# Patient Record
Sex: Male | Born: 1980 | Race: Black or African American | Hispanic: No | Marital: Married | Smoking: Never smoker
Health system: Southern US, Community
[De-identification: ages and names within clinical notes are randomized; demographics above are authoritative.]

---

## 2003-08-15 ENCOUNTER — Emergency Department (HOSPITAL_COMMUNITY): Admission: EM | Admit: 2003-08-15 | Discharge: 2003-08-16 | Payer: Self-pay | Admitting: Emergency Medicine

## 2003-08-18 ENCOUNTER — Emergency Department (HOSPITAL_COMMUNITY): Admission: EM | Admit: 2003-08-18 | Discharge: 2003-08-19 | Payer: Self-pay | Admitting: Podiatry
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
## 2019-09-19 ENCOUNTER — Other Ambulatory Visit: Payer: Self-pay

## 2019-09-19 ENCOUNTER — Emergency Department (HOSPITAL_COMMUNITY): Payer: No Typology Code available for payment source

## 2019-09-19 ENCOUNTER — Encounter (HOSPITAL_COMMUNITY): Payer: Self-pay | Admitting: Emergency Medicine

## 2019-09-19 ENCOUNTER — Emergency Department (HOSPITAL_COMMUNITY)
Admission: EM | Admit: 2019-09-19 | Discharge: 2019-09-20 | Disposition: A | Discharge status: Other Acute Inpatient Hospital | Payer: No Typology Code available for payment source | Attending: Emergency Medicine | Admitting: Emergency Medicine

## 2019-09-19 DIAGNOSIS — F1020 Alcohol dependence, uncomplicated: Secondary | ICD-10-CM | POA: Diagnosis not present

## 2019-09-19 DIAGNOSIS — F332 Major depressive disorder, recurrent severe without psychotic features: Secondary | ICD-10-CM | POA: Diagnosis present

## 2019-09-19 DIAGNOSIS — F1010 Alcohol abuse, uncomplicated: Secondary | ICD-10-CM

## 2019-09-19 DIAGNOSIS — R45851 Suicidal ideations: Secondary | ICD-10-CM | POA: Diagnosis not present

## 2019-09-19 DIAGNOSIS — Z20822 Contact with and (suspected) exposure to covid-19: Secondary | ICD-10-CM | POA: Diagnosis not present

## 2019-09-19 DIAGNOSIS — Y900 Blood alcohol level of less than 20 mg/100 ml: Secondary | ICD-10-CM | POA: Diagnosis not present

## 2019-09-19 MED ORDER — SODIUM CHLORIDE 0.9% FLUSH
3.0000 mL | Freq: Once | INTRAVENOUS | Status: AC
  Administered 2019-09-20: 3 mL via INTRAVENOUS

## 2019-09-19 NOTE — ED Triage Notes (Signed)
Patient states he has been drinking a 5th a day for "a long time." Patient states that drink was this morning. Patient noted to be tremulous and diaphoretic. Patient complaining of chest pain.

## 2019-09-20 ENCOUNTER — Ambulatory Visit (HOSPITAL_COMMUNITY)
Admission: EM | Admit: 2019-09-20 | Discharge: 2019-09-21 | Disposition: A | Discharge status: Home / Self Care | Payer: No Typology Code available for payment source | Attending: Nurse Practitioner | Admitting: Nurse Practitioner

## 2019-09-20 ENCOUNTER — Encounter (HOSPITAL_COMMUNITY): Payer: Self-pay | Admitting: Emergency Medicine

## 2019-09-20 DIAGNOSIS — F10139 Alcohol abuse with withdrawal, unspecified: Secondary | ICD-10-CM | POA: Diagnosis present

## 2019-09-20 DIAGNOSIS — F1014 Alcohol abuse with alcohol-induced mood disorder: Secondary | ICD-10-CM | POA: Diagnosis not present

## 2019-09-20 LAB — SARS CORONAVIRUS 2 BY RT PCR (HOSPITAL ORDER, PERFORMED IN ~~LOC~~ HOSPITAL LAB): SARS Coronavirus 2: NEGATIVE

## 2019-09-20 LAB — BASIC METABOLIC PANEL
Anion gap: 18 — ABNORMAL HIGH (ref 5–15)
BUN: 15 mg/dL (ref 6–20)
CO2: 24 mmol/L (ref 22–32)
Calcium: 10 mg/dL (ref 8.9–10.3)
Chloride: 93 mmol/L — ABNORMAL LOW (ref 98–111)
Creatinine, Ser: 1.1 mg/dL (ref 0.61–1.24)
GFR calc Af Amer: >60 mL/min (ref >60)
GFR calc non Af Amer: >60 mL/min (ref >60)
Glucose, Bld: 112 mg/dL — ABNORMAL HIGH (ref 70–99)
Potassium: 3.3 mmol/L — ABNORMAL LOW (ref 3.5–5.1)
Sodium: 135 mmol/L (ref 135–145)

## 2019-09-20 LAB — HEPATIC FUNCTION PANEL
ALT: 43 U/L (ref 0–44)
AST: 45 U/L — ABNORMAL HIGH (ref 15–41)
Albumin: 4.5 g/dL (ref 3.5–5.0)
Alkaline Phosphatase: 39 U/L (ref 38–126)
Bilirubin, Direct: <0.1 mg/dL (ref 0.0–0.2)
Total Bilirubin: 1.4 mg/dL — ABNORMAL HIGH (ref 0.3–1.2)
Total Protein: 7.1 g/dL (ref 6.5–8.1)

## 2019-09-20 LAB — CBC
HCT: 40.9 % (ref 39.0–52.0)
Hemoglobin: 13.8 g/dL (ref 13.0–17.0)
MCH: 30.3 pg (ref 26.0–34.0)
MCHC: 33.7 g/dL (ref 30.0–36.0)
MCV: 89.9 fL (ref 80.0–100.0)
Platelets: 182 10*3/uL (ref 150–400)
RBC: 4.55 MIL/uL (ref 4.22–5.81)
RDW: 13.1 % (ref 11.5–15.5)
WBC: 3.8 10*3/uL — ABNORMAL LOW (ref 4.0–10.5)
nRBC: 0 % (ref 0.0–0.2)

## 2019-09-20 LAB — ETHANOL: Alcohol, Ethyl (B): 16 mg/dL — ABNORMAL HIGH (ref <10)

## 2019-09-20 LAB — TROPONIN I (HIGH SENSITIVITY)
Troponin I (High Sensitivity): 4 ng/L (ref <18)
Troponin I (High Sensitivity): 4 ng/L (ref <18)

## 2019-09-20 LAB — LIPASE, BLOOD: Lipase: 20 U/L (ref 11–51)

## 2019-09-20 LAB — SALICYLATE LEVEL: Salicylate Lvl: <7 mg/dL — ABNORMAL LOW (ref 7.0–30.0)

## 2019-09-20 LAB — ACETAMINOPHEN LEVEL: Acetaminophen (Tylenol), Serum: <10 ug/mL — ABNORMAL LOW (ref 10–30)

## 2019-09-20 MED ORDER — ALUM & MAG HYDROXIDE-SIMETH 200-200-20 MG/5ML PO SUSP: 30.0000 mL | ORAL | Status: DC | PRN

## 2019-09-20 MED ORDER — LORAZEPAM 2 MG/ML IJ SOLN
0.5000 mg | Freq: Once | INTRAMUSCULAR | Status: AC
  Administered 2019-09-20: 0.5 mg via INTRAVENOUS
  Filled 2019-09-20: qty 1

## 2019-09-20 MED ORDER — SODIUM CHLORIDE 0.9 % IV BOLUS
1000.0000 mL | Freq: Once | INTRAVENOUS | Status: AC
  Administered 2019-09-20: 1000 mL via INTRAVENOUS

## 2019-09-20 MED ORDER — THIAMINE HCL 100 MG/ML IJ SOLN
100.0000 mg | Freq: Once | INTRAMUSCULAR | Status: AC
  Administered 2019-09-20: 100 mg via INTRAMUSCULAR
  Filled 2019-09-20: qty 2

## 2019-09-20 MED ORDER — ACETAMINOPHEN 325 MG PO TABS
650.0000 mg | ORAL_TABLET | Freq: Once | ORAL | Status: AC
  Administered 2019-09-20: 650 mg via ORAL
  Filled 2019-09-20: qty 2

## 2019-09-20 MED ORDER — ADULT MULTIVITAMIN W/MINERALS CH
1.0000 | ORAL_TABLET | Freq: Every day | ORAL | Status: DC
  Administered 2019-09-20 – 2019-09-21 (×2): 1 via ORAL
  Filled 2019-09-20 (×2): qty 1

## 2019-09-20 MED ORDER — THIAMINE HCL 100 MG PO TABS
100.0000 mg | ORAL_TABLET | Freq: Every day | ORAL | Status: DC
  Administered 2019-09-21: 100 mg via ORAL
  Filled 2019-09-20 (×2): qty 1

## 2019-09-20 MED ORDER — MAGNESIUM HYDROXIDE 400 MG/5ML PO SUSP: 30.0000 mL | Freq: Every day | ORAL | Status: DC | PRN

## 2019-09-20 MED ORDER — CHLORDIAZEPOXIDE HCL 25 MG PO CAPS: 25.0000 mg | ORAL_CAPSULE | Freq: Four times a day (QID) | ORAL | Status: DC | PRN

## 2019-09-20 MED ORDER — CHLORDIAZEPOXIDE HCL 25 MG PO CAPS: 25.0000 mg | ORAL_CAPSULE | Freq: Every day | ORAL | Status: DC

## 2019-09-20 MED ORDER — CHLORDIAZEPOXIDE HCL 25 MG PO CAPS
25.0000 mg | ORAL_CAPSULE | Freq: Four times a day (QID) | ORAL | Status: AC
  Administered 2019-09-20 (×4): 25 mg via ORAL
  Filled 2019-09-20 (×4): qty 1

## 2019-09-20 MED ORDER — TRAZODONE HCL 50 MG PO TABS
50.0000 mg | ORAL_TABLET | Freq: Every evening | ORAL | Status: DC | PRN
  Administered 2019-09-21: 50 mg via ORAL
  Filled 2019-09-20: qty 1

## 2019-09-20 MED ORDER — CHLORDIAZEPOXIDE HCL 25 MG PO CAPS
25.0000 mg | ORAL_CAPSULE | Freq: Three times a day (TID) | ORAL | Status: DC
  Administered 2019-09-21: 25 mg via ORAL
  Filled 2019-09-20: qty 1

## 2019-09-20 MED ORDER — LORAZEPAM 0.5 MG PO TABS
0.5000 mg | ORAL_TABLET | Freq: Once | ORAL | Status: AC
  Administered 2019-09-20: 0.5 mg via ORAL
  Filled 2019-09-20: qty 1

## 2019-09-20 MED ORDER — LOPERAMIDE HCL 2 MG PO CAPS: 2.0000 mg | ORAL_CAPSULE | ORAL | Status: DC | PRN

## 2019-09-20 MED ORDER — CHLORDIAZEPOXIDE HCL 25 MG PO CAPS
25.0000 mg | ORAL_CAPSULE | Freq: Once | ORAL | Status: AC
  Administered 2019-09-20: 25 mg via ORAL
  Filled 2019-09-20: qty 1

## 2019-09-20 MED ORDER — HYDROXYZINE HCL 25 MG PO TABS
25.0000 mg | ORAL_TABLET | Freq: Four times a day (QID) | ORAL | Status: DC | PRN
  Administered 2019-09-21: 25 mg via ORAL
  Filled 2019-09-20: qty 1

## 2019-09-20 MED ORDER — ONDANSETRON 4 MG PO TBDP: 4.0000 mg | ORAL_TABLET | Freq: Four times a day (QID) | ORAL | Status: DC | PRN

## 2019-09-20 MED ORDER — CHLORDIAZEPOXIDE HCL 25 MG PO CAPS: 25.0000 mg | ORAL_CAPSULE | ORAL | Status: DC

## 2019-09-20 MED ORDER — ACETAMINOPHEN 325 MG PO TABS
650.0000 mg | ORAL_TABLET | Freq: Four times a day (QID) | ORAL | Status: DC | PRN
  Administered 2019-09-20 – 2019-09-21 (×2): 650 mg via ORAL
  Filled 2019-09-20 (×2): qty 2

## 2019-09-20 NOTE — ED Notes (Signed)
D: Patient denies SI/HI A/V/H. Patient did ask to speak with a therapist. Patient is currently sitting on bed talking with therapist.  A: Patient spoke therapist. R: Patient remains safe on unit. Monitoring continues.

## 2019-09-20 NOTE — ED Notes (Signed)
Patient continues to rest in bed with eyes closed. Respirations even and non labored no distress noted. Monitoring continues.

## 2019-09-20 NOTE — ED Notes (Signed)
D: Pt denies SI/HI/AV hallucinations. Pt is pleasant and cooperative. Pt states he is here for detox from alcohol and depression.  A: Pt was offered support and encouragement. Patient skin assessed and belongings and patient searched. Patient skin is intact. No contraband found. Patient is COVID negative. Patient oriented to unit and staff. R: Pt has no complaints.Pt receptive to treatment and safety maintained on unit.

## 2019-09-20 NOTE — ED Provider Notes (Signed)
Behavioral Health Admission [facility]&P [facility])  Date: 09/20/19 Patient Name: Donald Woods MRN: [id number] Chief Complaint: No chief complaint on file.     Diagnoses:  Final diagnoses:  None    HPI: 39 year old separated male who presents unaccompanied to [facility] reporting symptoms of alcohol withdrawal and depression. Pt says he has been drinking approximately one fifth of liquor daily for several weeks. He says he is seeking treatment at this time because he realized he needed help to stop. Pt reports he experiences alcohol withdrawal symptoms including tremors, sweats and nausea. He suspects he experiences blackouts. He denies history of seizures. He denies other substance use. Pt describes his mood as depressed and states he has a history of depression. He acknowledges symptoms including social withdrawal, loss of interest in usual pleasures, fatigue, irritability, decreased sleep, decreased appetite and feelings of guilt, worthlessness and hopelessness. He reports recurring suicidal ideation with no current intent. He says approximately two months ago he attempted suicide by putting a machete to his throat. He says a cousin attempted suicide. Pt denies any history of intentional self-injurious behaviors. Pt denies current homicidal ideation or history of violence. Pt denies any history of auditory or visual hallucinations.  Pt says he recently relocated from [place] to [place] to be closer to his family. He says for a time he was essentially homeless. He says he now has a permanent residence and lives alone. He says he is unemployed. He has two children who are being care for by their mother. He denies history of abuse or trauma. He denies current legal problems. He denies access to firearms.  He reports he is currently seeing a therapist, Darliss Cheney. He says he as had two visits with a psychiatrist in the past but is not currently prescribed mental health medication. He says he was  admitted to a alcohol detox facility in [place] four years ago.  PHQ 2-9:    ED from 09/19/2019 in [facility]  Thoughts that you would be better off dead, or of hurting yourself in some way Several days  PHQ-9 Total Score 13        Total Time spent with patient: 30 minutes  Musculoskeletal  Strength & Muscle Tone: within normal limits Gait & Station: normal Patient leans: N/A  Psychiatric Specialty Exam  Presentation General Appearance: Casual  Eye Contact:Good  Speech:Clear and Coherent;Normal Rate  Speech Volume:Normal  Handedness:Right   Mood and Affect  Mood:Depressed  Affect:Congruent   Thought Process  Thought Processes:Coherent  Descriptions of Associations:Intact  Orientation:Full (Time, Place and Person)  Thought Content:WDL  Hallucinations:Hallucinations: None  Ideas of Reference:None  Suicidal Thoughts:Suicidal Thoughts: Yes, Passive  Homicidal Thoughts:Homicidal Thoughts: No   Sensorium  Memory:Immediate Good;Recent Good;Remote Good  Judgment:Fair  Insight:Fair   Executive Functions  Concentration:Fair  Attention Span:Fair  Recall:Good  Fund of Knowledge:Fair  Language:Fair   Psychomotor Activity  Psychomotor Activity:Psychomotor Activity: Normal   Assets  Assets:Communication Skills;Desire for Improvement   Sleep  Sleep:Sleep: Fair   Physical Exam Vitals and nursing note reviewed.  Constitutional:      Appearance: He is well-developed.  Cardiovascular:     Rate and Rhythm: Normal rate.  Pulmonary:     Effort: Pulmonary effort is normal.  Musculoskeletal:        General: Normal range of motion.  Skin:    General: Skin is warm.  Neurological:     Mental Status: He is alert and oriented to person, place, and  time.    Review of Systems  Constitutional: Negative.   HENT: Negative.   Eyes: Negative.   Respiratory: Negative.   Cardiovascular: Negative.   Gastrointestinal:  Negative.   Genitourinary: Negative.   Musculoskeletal: Negative.   Skin: Negative.   Neurological: Negative.   Endo/Heme/Allergies: Negative.   Psychiatric/Behavioral: Positive for substance abuse and suicidal ideas.    Blood pressure (!) 139/101, pulse 78, temperature (!) 97.5 F (36.4 C), temperature source Temporal, resp. rate 18, SpO2 100 %. There is no height or weight on file to calculate BMI.  Past Psychiatric History: Substance abuse, MDD   Is the patient at risk to self? Yes  Has the patient been a risk to self in the past 6 months? No .    Has the patient been a risk to self within the distant past? No   Is the patient a risk to others? No   Has the patient been a risk to others in the past 6 months? No   Has the patient been a risk to others within the distant past? No   Past Medical History: History reviewed. No pertinent past medical history. History reviewed. No pertinent surgical history.  Family History: History reviewed. No pertinent family history.  Social History:  Social History   Socioeconomic History  . Marital status: Married    Spouse name: Not on file  . Number of children: Not on file  . Years of education: Not on file  . Highest education level: Not on file  Occupational History  . Not on file  Tobacco Use  . Smoking status: Never Smoker  . Smokeless tobacco: Never Used  Substance and Sexual Activity  . Alcohol use: Yes    Comment: 1/5 of liquor a day  . Drug use: Never  . Sexual activity: Not on file  Other Topics Concern  . Not on file  Social History Narrative  . Not on file   Social Determinants of Health   Financial Resource Strain:   . Difficulty of Paying Living Expenses:   Food Insecurity:   . Worried About Programme researcher, broadcasting/film/video in the Last Year:   . Barista in the Last Year:   Transportation Needs:   . Freight forwarder (Medical):   Marland Kitchen Lack of Transportation (Non-Medical):   Physical Activity:   . Days of  Exercise per Week:   . Minutes of Exercise per Session:   Stress:   . Feeling of Stress :   Social Connections:   . Frequency of Communication with Friends and Family:   . Frequency of Social Gatherings with Friends and Family:   . Attends Religious Services:   . Active Member of Clubs or Organizations:   . Attends Banker Meetings:   Marland Kitchen Marital Status:   Intimate Partner Violence:   . Fear of Current or Ex-Partner:   . Emotionally Abused:   Marland Kitchen Physically Abused:   . Sexually Abused:     SDOH:  SDOH Screenings   Alcohol Screen:   . Last Alcohol Screening Score (AUDIT):   Depression (PHQ2-9): Medium Risk  . PHQ-2 Score: 13  Financial Resource Strain:   . Difficulty of Paying Living Expenses:   Food Insecurity:   . Worried About Programme researcher, broadcasting/film/video in the Last Year:   . [employer] of Food in the Last Year:   Housing:   . Last Housing Risk Score:   Physical Activity:   . Days of Exercise  per Week:   . Minutes of Exercise per Session:   Social Connections:   . Frequency of Communication with Friends and Family:   . Frequency of Social Gatherings with Friends and Family:   . Attends Religious Services:   . Active Member of Clubs or Organizations:   . Attends Banker Meetings:   Marland Kitchen Marital Status:   Stress:   . Feeling of Stress :   Tobacco Use: Low Risk   . Smoking Tobacco Use: Never Smoker  . Smokeless Tobacco Use: Never Used  Transportation Needs:   . Freight forwarder (Medical):   Marland Kitchen Lack of Transportation (Non-Medical):     Last Labs:  Admission on 09/19/2019, Discharged on 09/20/2019  Component Date Value Ref Range Status  . Sodium 09/19/2019 135  135 - 145 mmol/L Final  . Potassium 09/19/2019 3.3* 3.5 - 5.1 mmol/L Final  . Chloride 09/19/2019 93* 98 - 111 mmol/L Final  . CO2 09/19/2019 24  22 - 32 mmol/L Final  . Glucose, Bld 09/19/2019 112* 70 - 99 mg/dL Final   Glucose reference range applies only to samples taken after fasting  for at least 8 hours.  . BUN 09/19/2019 15  6 - 20 mg/dL Final  . Creatinine, Ser 09/19/2019 1.10  0.61 - 1.24 mg/dL Final  . Calcium 29/93/7169 10.0  8.9 - 10.3 mg/dL Final  . GFR calc non Af Amer 09/19/2019 >60  >60 mL/min Final  . GFR calc Af Amer 09/19/2019 >60  >60 mL/min Final  . Anion gap 09/19/2019 18* 5 - 15 Final   Performed at [facility], [address], [place], [place] [address]  . WBC 09/19/2019 3.8* 4.0 - 10.5 K/uL Final  . RBC 09/19/2019 4.55  4.22 - 5.81 MIL/uL Final  . Hemoglobin 09/19/2019 13.8  13.0 - 17.0 g/dL Final  . HCT 81/03/7508 40.9  39 - 52 % Final  . MCV 09/19/2019 89.9  80.0 - 100.0 fL Final  . MCH 09/19/2019 30.3  26.0 - 34.0 pg Final  . MCHC 09/19/2019 33.7  30.0 - 36.0 g/dL Final  . RDW 25/85/2778 13.1  11.5 - 15.5 % Final  . Platelets 09/19/2019 182  150 - 400 K/uL Final  . nRBC 09/19/2019 0.0  0.0 - 0.2 % Final   Performed at [facility], [address], [place], [place] [address]  . Troponin I (High Sensitivity) 09/19/2019 4  <18 ng/L Final   Comment: (NOTE) Elevated high sensitivity troponin I (hsTnI) values and significant  changes across serial measurements may suggest ACS but many other  chronic and acute conditions are known to elevate hsTnI results.  Refer to the "Links" section for chest pain algorithms and additional  guidance. Performed at [facility], [address] [place], [place] [address]   . Total Protein 09/20/2019 7.1  6.5 - 8.1 g/dL Final  . Albumin 31/54/0086 4.5  3.5 - 5.0 g/dL Final  . AST 76/19/5093 45* 15 - 41 U/L Final  . ALT 09/20/2019 43  0 - 44 U/L Final  . Alkaline Phosphatase 09/20/2019 39  38 - 126 U/L Final  . Total Bilirubin 09/20/2019 1.4* 0.3 - 1.2 mg/dL Final  . Bilirubin, Direct 09/20/2019 <0.1  0.0 - 0.2 mg/dL Final  . Indirect Bilirubin 09/20/2019 NOT CALCULATED  0.3 - 0.9 mg/dL Final   Performed at [facility], [address] [place], [place] [address]  . Lipase 09/20/2019 20  11 -  51 U/L Final   Performed at [facility], [address] [place], [place] [address]  . Alcohol, Ethyl (B) 09/20/2019 16* <10 mg/dL Final   Comment: (NOTE) Lowest detectable limit for serum alcohol is 10 mg/dL.  For medical purposes only. Performed at [facility], [address] [place], [place] [address]   . Acetaminophen (Tylenol), Serum 09/20/2019 <10* 10 - 30 ug/mL Final   Comment: (NOTE) Therapeutic concentrations vary significantly. A range of 10-30 ug/mL  may be an effective concentration for many patients. However, some  are best treated at concentrations outside of this range. Acetaminophen concentrations >150 ug/mL at 4 hours after ingestion  and >50 ug/mL at 12 hours after ingestion are often associated with  toxic reactions.  Performed at [facility], [address] [place], [place] [address]   . Salicylate Lvl 09/20/2019 <7.0* 7.0 - 30.0 mg/dL Final   Performed at [facility], [address], [place], [place] [address]  . Troponin I (High Sensitivity) 09/20/2019 4  <18 ng/L Final   Comment: (NOTE) Elevated high sensitivity troponin I (hsTnI) values and significant  changes across serial measurements may suggest ACS but many other  chronic and acute conditions are known to elevate hsTnI results.  Refer to the "Links" section for chest pain algorithms and additional  guidance. Performed at [facility], [address] [place], [place] [address]   . SARS Coronavirus 2 09/20/2019 NEGATIVE  NEGATIVE Final   Comment: (NOTE) SARS-CoV-2 target nucleic acids are NOT DETECTED.  The SARS-CoV-2 RNA is generally detectable in upper and lower respiratory specimens during the acute phase of infection. The lowest concentration of SARS-CoV-2 viral copies this assay can detect is 250 copies / mL. A negative result  does not preclude SARS-CoV-2 infection and should not be used as the sole basis for treatment or other patient management decisions.  A negative result may occur with improper specimen collection / handling, submission of specimen other than nasopharyngeal swab, presence of viral mutation(s) within the areas targeted by this assay, and inadequate number of viral copies (<250 copies / mL). A negative result must be combined with clinical observations, patient history, and epidemiological information.  Fact Sheet for Patients:   BoilerBrush.com.cy  Fact Sheet for Healthcare Providers: https://pope.com/  This test is not yet approved or                           cleared by the [place] FDA and has been authorized for detection and/or diagnosis of SARS-CoV-2 by FDA under an Emergency Use Authorization (EUA).  This EUA will remain in effect (meaning this test can be used) for the duration of the COVID-19 declaration under Section 564(b)(1) of the Act, 21 U.S.C. section 360bbb-3(b)(1), unless the authorization is terminated or revoked sooner.  Performed at [facility], [address] [place], [place] [address]     Allergies: Patient has no known allergies.  PTA Medications: (Not in a hospital admission)   Medical Decision Making  Medically cleared at ED Started on Librium Detox Protocol for withdrawals    Recommendations  Based on my evaluation the patient does not appear to have an emergency medical condition.  Gerlene Burdock Kristene Liberati, FNP 09/20/19  2:05 PM

## 2019-09-20 NOTE — ED Notes (Signed)
Patient resting in bed with eyes closed. Respirations even and non labored. No distress noted. Monitoring continues. 

## 2019-09-20 NOTE — ED Notes (Signed)
Safe transport contacted for transport 

## 2019-09-20 NOTE — ED Provider Notes (Signed)
Signout from Dr. Eudelia Bunch. 39 year old male alcohol dependence here with withdrawal symptoms and some suicidal ideation. Plan is to follow-up on Covid testing and then transfer to [facility]. Physical Exam  BP 128/75    Pulse 64    Temp 98.3 F (36.8 C) (Oral)    Resp 17    Ht 5\' 9"  (1.753 m)    Wt 77.1 kg    SpO2 100%    BMI 25.10 kg/m   Physical Exam  ED Course/Procedures     Procedures  MDM  Patient Covid test is negative. Discussed with money who accepts the patient for Dr. Feliz Beam in transfer. Nurse to call report.       Lucianne Muss, MD 09/20/19 [phone]

## 2019-09-20 NOTE — BH Assessment (Signed)
Comprehensive Clinical Assessment (CCA) Screening, Triage and Referral Note  09/20/2019 Donald Woods [id number]  Pt is a 39 year old separated male who presents unaccompanied to [facility] reporting symptoms of alcohol withdrawal and depression. Pt says he has been drinking approximately one fifth of liquor daily for several weeks. He says he is seeking treatment at this time because he realized he needed help to stop. Pt reports he experiences alcohol withdrawal symptoms including tremors, sweats and nausea. He suspects he experiences blackouts. He denies history of seizures. He denies other substance use.  Pt describes his mood as depressed and states he has a history of depression. He acknowledges symptoms including social withdrawal, loss of interest in usual pleasures, fatigue, irritability, decreased sleep, decreased appetite and feelings of guilt, worthlessness and hopelessness. He reports recurring suicidal ideation with no current intent. He says approximately two months ago he attempted suicide by putting a machete to his throat. He says a cousin attempted suicide. Pt denies any history of intentional self-injurious behaviors. Pt denies current homicidal ideation or history of violence. Pt denies any history of auditory or visual hallucinations.   Pt says he recently relocated from [place] to [place] to be closer to his family. He says for a time he was essentially homeless. He says he now has a permanent residence and lives alone. He says he is unemployed. He has two children who are being care for by their mother. He denies history of abuse or trauma. He denies current legal problems. He denies access to firearms.   He reports he is currently seeing a therapist, Darliss Cheney. He says he as had two visits with a psychiatrist in the past but is not currently prescribed mental health medication. He says he was admitted to a alcohol detox facility in [place] four years ago.  Pt does  not identify anyone to contact for collateral information.  Pt is dressed in hospital scrubs, alert and oriented x4. Pt speaks in a clear tone, at moderate volume and normal pace. Motor behavior appears normal. Pt's mood is depressed and affect is congruent with mood. Thought process is coherent and relevant. There is no indication Pt is currently responding to internal stimuli or experiencing delusional thought content. Pt was calm and cooperative throughout assessment. He says he is willing to sign voluntarily into a treatment facility.  Visit Diagnosis:  F33.2 Major depressive disorder, Recurrent episode, Severe F10.20 Alcohol use disorder, Severe   DISPOSITION: Gave clinical report to Lindon Romp, FNP who recommended Pt be transferred to [facility] for continuous assessment. Pt will need resulted COVID test. Notified Dr. Leonette Monarch and Philis Pique, RN of recommendation. Notified Patient Access at [facility] of pending transfer.   PHQ9 SCORE ONLY 09/20/2019  PHQ-9 Total Score 13    Patient Reported Information How did you hear about [place]? Self   Referral name: No data recorded  Referral phone number: No data recorded Whom do you see for routine medical problems? Other (Comment) [facility])   Practice/Facility Name: No data recorded  Practice/Facility Phone Number: No data recorded  Name of Contact: No data recorded  Contact Number: No data recorded  Contact Fax Number: No data recorded  Prescriber Name: No data recorded  Prescriber Address (if known): No data recorded What Is the Reason for Your Visit/Call Today? Pt requesting treatment for alcohol use and depression  How Long Has This Been Causing You Problems? > than 6 months  Have You Recently Been in Any Inpatient Treatment (Hospital/Detox/Crisis Center/28-Day Program)? No  Name/Location of Program/Hospital:No data recorded  How Long Were You There? No data recorded  When Were You Discharged? No data recorded Have You Ever Received  Services From [facility] Before? No   Who Do You See at [facility]? No data recorded Have You Recently Had Any Thoughts About Hurting Yourself? Yes   Are You Planning to Commit Suicide/Harm Yourself At This time?  No  Have you Recently Had Thoughts About Hurting Someone Karolee Ohs? No   Explanation: No data recorded Have You Used Any Alcohol or Drugs in the Past 24 Hours? Yes   How Long Ago Did You Use Drugs or Alcohol?  1200   What Did You Use and How Much? 1 fifth of liquor  What Do You Feel Would Help You the Most Today? Other (Comment) (Inpatient treatment)  Do You Currently Have a Therapist/Psychiatrist? Yes   Name of Therapist/Psychiatrist: Halliary Mooney   Have You Been Recently Discharged From Any Office Practice or Programs? No   Explanation of Discharge From Practice/Program:  No data recorded    CCA Screening Triage Referral Assessment Type of Contact: Tele-Assessment   Is this Initial or Reassessment? Initial Assessment   Date Telepsych consult ordered in [facility]:  09/20/19   Time Telepsych consult ordered in [facility]:  0239  Patient Reported Information Reviewed? Yes   Patient Left Without Being Seen? No data recorded  Reason for Not Completing Assessment: No data recorded Collateral Involvement: None  Does Patient Have a Court Appointed Legal Guardian? No data recorded  Name and Contact of Legal Guardian:  No data recorded If Minor and Not Living with Parent(s), Who has Custody? No data recorded Is CPS involved or ever been involved? Never  Is APS involved or ever been involved? Never  Patient Determined To Be At Risk for Harm To Self or Others Based on Review of Patient Reported Information or Presenting Complaint? Yes, for Self-Harm   Method: No data recorded  Availability of Means: No data recorded  Intent: No data recorded  Notification Required: No data recorded  Additional Information for Danger to Others Potential:  No data recorded  Additional  Comments for Danger to Others Potential:  No data recorded  Are There Guns or Other Weapons in Your Home?  No data recorded   Types of Guns/Weapons: No data recorded   Are These Weapons Safely Secured?                              No data recorded   Who Could Verify You Are Able To Have These Secured:    No data recorded Do You Have any Outstanding Charges, Pending Court Dates, Parole/Probation? No data recorded Contacted To Inform of Risk of Harm To Self or Others: Unable to Contact:  Location of Assessment: [facility] ED  Does Patient Present under Involuntary Commitment? No   IVC Papers Initial File Date: No data recorded  [place] of Residence: [place]  Patient Currently Receiving the Following Services: Individual Therapy   Determination of Need: Emergent (2 hours)   Options For Referral: [facility]   Patsy [place], Harlin Rain, [facility]

## 2019-09-20 NOTE — ED Provider Notes (Signed)
[facility]-EMERGENCY DEPT Provider Note  CSN: [id number] Arrival date & time: 09/19/19 2301  Chief Complaint(s) Withdrawal (Alcohol)  HPI Donald Woods is a 39 y.o. male   The history is provided by the patient.  Alcohol Problem This is a chronic problem. Episode onset: several years. The problem occurs daily. The problem has not changed since onset.Associated symptoms include abdominal pain. Pertinent negatives include no chest pain, no headaches and no shortness of breath. Nothing aggravates the symptoms. Nothing relieves the symptoms. He has tried nothing for the symptoms.   Drinks a 5th of rum daily. Believes he is going through WD. Last drink 2 hrs ago  Report passive SI. Recently moved to the area after separating from wife. Lives with sister.  Past Medical History History reviewed. No pertinent past medical history. There are no problems to display for this patient.  Home Medication(s) Prior to Admission medications   Not on File                                                                                                                                    Past Surgical History History reviewed. No pertinent surgical history. Family History No family history on file.  Social History Social History   Tobacco Use  . Smoking status: Never Smoker  . Smokeless tobacco: Never Used  Substance Use Topics  . Alcohol use: Yes    Comment: 1/5 of liquor a day  . Drug use: Never   Allergies Patient has no known allergies.  Review of Systems Review of Systems  Respiratory: Negative for shortness of breath.   Cardiovascular: Negative for chest pain.  Gastrointestinal: Positive for abdominal pain.  Neurological: Negative for headaches.   All other systems are reviewed and are negative for acute change except as noted in the HPI  Physical Exam Vital Signs  I have reviewed the triage vital signs BP (!) 164/99   Pulse 91   Temp 98.3 F (36.8 C)  (Oral)   Resp 18   Ht 5\' 9"  (1.753 m)   Wt 77.1 kg   SpO2 99%   BMI 25.10 kg/m   Physical Exam Vitals reviewed.  Constitutional:      General: He is not in acute distress.    Appearance: He is well-developed. He is diaphoretic (mild).  HENT:     Head: Normocephalic and atraumatic.     Nose: Nose normal.  Eyes:     General: No scleral icterus.       Right eye: No discharge.        Left eye: No discharge.     Conjunctiva/sclera: Conjunctivae normal.     Pupils: Pupils are equal, round, and reactive to light.  Neck:   Cardiovascular:     Rate and Rhythm: Normal rate and regular rhythm.     Heart sounds: No murmur heard.  No friction rub. No gallop.   Pulmonary:  Effort: Pulmonary effort is normal. No respiratory distress.     Breath sounds: Normal breath sounds. No stridor. No rales.  Abdominal:     General: There is no distension.     Palpations: Abdomen is soft.     Tenderness: There is abdominal tenderness (mild) in the epigastric area. There is no guarding or rebound.  Musculoskeletal:        General: No tenderness.     Cervical back: Normal range of motion and neck supple.  Skin:    General: Skin is warm.     Findings: No erythema or rash.  Neurological:     Mental Status: He is alert and oriented to person, place, and time.     ED Results and Treatments Labs (all labs ordered are listed, but only abnormal results are displayed) Labs Reviewed  BASIC METABOLIC PANEL - Abnormal; Notable for the following components:      Result Value   Potassium 3.3 (*)    Chloride 93 (*)    Glucose, Bld 112 (*)    Anion gap 18 (*)    All other components within normal limits  CBC - Abnormal; Notable for the following components:   WBC 3.8 (*)    All other components within normal limits  HEPATIC FUNCTION PANEL - Abnormal; Notable for the following components:   AST 45 (*)    Total Bilirubin 1.4 (*)    All other components within normal limits  ETHANOL - Abnormal;  Notable for the following components:   Alcohol, Ethyl (B) 16 (*)    All other components within normal limits  ACETAMINOPHEN LEVEL - Abnormal; Notable for the following components:   Acetaminophen (Tylenol), Serum <10 (*)    All other components within normal limits  SALICYLATE LEVEL - Abnormal; Notable for the following components:   Salicylate Lvl <7.0 (*)    All other components within normal limits  SARS CORONAVIRUS 2 BY RT PCR (HOSPITAL ORDER, PERFORMED IN [facility] LAB)  LIPASE, BLOOD  TROPONIN I (HIGH SENSITIVITY)  TROPONIN I (HIGH SENSITIVITY)                                                                                                                         EKG  EKG Interpretation  Date/Time:  Sunday September 19 2019 23:30:29 EDT Ventricular Rate:  91 PR Interval:    QRS Duration: 95 QT Interval:  364 QTC Calculation: 448 R Axis:   55 Text Interpretation: Normal sinus rhythm 12 Lead; Mason-Likar Artifact , motion NO STEMI. No old tracing to compare Confirmed by Drema Pry [phone]) on 09/20/2019 1:11:32 AM      Radiology DG Chest 2 View  Result Date: 09/20/2019 CLINICAL DATA:  Chest pain. EXAM: CHEST - 2 VIEW COMPARISON:  None. FINDINGS: The cardiomediastinal contours are normal. The lungs are clear. Pulmonary vasculature is normal. No consolidation, pleural effusion, or pneumothorax. No acute osseous abnormalities are seen. IMPRESSION: Negative radiographs of the chest. Electronically Signed  By: Narda Rutherford M.D.   On: 09/20/2019 00:18    Pertinent labs & imaging results that were available during my care of the patient were reviewed by me and considered in my medical decision making (see chart for details).  Medications Ordered in ED Medications  sodium chloride flush (NS) 0.9 % injection 3 mL (3 mLs Intravenous Given 09/20/19 0100)  chlordiazePOXIDE (LIBRIUM) capsule 25 mg (25 mg Oral Given 09/20/19 0109)  sodium chloride 0.9 % bolus 1,000 mL (0 mLs  Intravenous Stopped 09/20/19 0205)  LORazepam (ATIVAN) injection 0.5 mg (0.5 mg Intravenous Given 09/20/19 0109)  chlordiazePOXIDE (LIBRIUM) capsule 25 mg (25 mg Oral Given 09/20/19 0517)  acetaminophen (TYLENOL) tablet 650 mg (650 mg Oral Given 09/20/19 0517)  LORazepam (ATIVAN) tablet 0.5 mg (0.5 mg Oral Given 09/20/19 0517)                                                                                                                                    Procedures Procedures  (including critical care time)  Medical Decision Making / ED Course I have reviewed the nursing notes for this encounter and the patient's prior records (if available in EHR or on provided paperwork).   Yeiren Whitecotton was evaluated in Emergency Department on 09/20/2019 for the symptoms described in the history of present illness. He was evaluated in the context of the global COVID-19 pandemic, which necessitated consideration that the patient might be at risk for infection with the SARS-CoV-2 virus that causes COVID-19. Institutional protocols and algorithms that pertain to the evaluation of patients at risk for COVID-19 are in a state of rapid change based on information released by regulatory bodies including the CDC and federal and state organizations. These policies and algorithms were followed during the patient's care in the ED.  Passive SI. Daily etoh use Mild tremors and diaphoresis. No tachycardia or fever  Labs reassuring.  Given small IV dose of ativan and librium. Stable.  Medically cleared for [facility] eval. They recommend sending him to [facility] after negative COVID.     Final Clinical Impression(s) / ED Diagnoses Final diagnoses:  Alcohol abuse  Suicidal thoughts      This chart was dictated using voice recognition software.  Despite best efforts to proofread,  errors can occur which can change the documentation meaning.   Nira Conn, MD 09/20/19 [phone]

## 2019-09-20 NOTE — ED Notes (Signed)
Pt requesting something for headache, CIWA scored and MD notified

## 2019-09-21 DIAGNOSIS — F10139 Alcohol abuse with withdrawal, unspecified: Secondary | ICD-10-CM | POA: Diagnosis present

## 2019-09-21 NOTE — ED Notes (Addendum)
Patient belongings in locker #05

## 2019-09-21 NOTE — ED Notes (Signed)
Eating breakfast 

## 2019-09-21 NOTE — ED Notes (Signed)
Pt out to fresh air. Stayed outside for about . Appropriate interactions with staff and other pt. No SI or SA.

## 2019-09-21 NOTE — ED Notes (Signed)
Pt states, "I have just a little trembling but that Librium is definitely helping. I'm ready to discharge now. I feel a lot better". Denies SI/HI. Encouraged pt to notify staff with any needs or concerns. Safety maintained.

## 2019-09-21 NOTE — ED Notes (Signed)
Pt A&Ox4. Denies SI/HI this am. Eating bkft without difficulty. Denies withdrawal sx from ETOH. Will continue to monitor for safety.

## 2019-09-21 NOTE — Discharge Instructions (Signed)
Please see attached for follow up appointment information. Above you will see information for Primary Care Providers to include [facility].

## 2019-09-21 NOTE — ED Notes (Signed)
Patient is restless 

## 2019-09-21 NOTE — ED Notes (Signed)
Pt discharged to home via transport from sister. Verbalized understanding of all discharge instructions. Denies SI/HI upon discharge. Pleasant and cooperative. Safety maintained.

## 2019-09-21 NOTE — ED Provider Notes (Signed)
FBC/OBS ASAP Discharge Summary  Date and Time: 09/21/2019 1:09 PM  Name: Donald Woods  MRN:  [id number]   Discharge Diagnoses:  Final diagnoses:  None    Subjective:39 year old separated male who presents unaccompanied to [facility] ED reporting symptoms of alcohol withdrawal and depression. Pt says he has been drinking approximately one fifth of liquor daily for several weeks. He says he is seeking treatment at this time because he realized he needed help to stop. Pt reports he experiences alcohol withdrawal symptoms including tremors, sweats and nausea. He suspects he experiences blackouts. He denies history of seizures. He denies other substance use. Pt describes his mood as depressed and states he has a history of depression. Heacknowledges symptoms including social withdrawal, loss of interest in usual pleasures, fatigue, irritability, decreased sleep, decreased appetite and feelings of guilt, worthlessness and hopelessness. He reports recurring suicidal ideation with no current intent. He says approximately two months ago he attempted suicide by putting a machete to his throat. He says a cousin attempted suicide.Pt denies any history of intentional self-injurious behaviors. Pt denies current homicidal ideation or history of violence. Pt denies any history of auditory or visual hallucinations. Pt says he recently relocated from [place] to [place] to be closer to his family. He says for a time he was essentially homeless. He says he now has a permanent residence and lives alone. He says he is unemployed. He has two children who are being care for by their mother. He denies history of abuse or trauma. He denies current legal problems. He denies access to firearms.  He reports he is currently seeing a therapist, Delma Officer. He says he as had two visits with a psychiatrist in the past but is not currently prescribed mental health medication. He says he was admitted to a alcohol detox  facility in [place] four years ago.  Stay Summary: Patient was admitted to behavioral health observation unit.  He was started on Librium detox protocol for his withdrawals.  His alcohol level on admission 2 days ago was 16.  He was observed closely for withdrawal symptoms and severe alcohol complications.  Order was placed for peer support for patient to receive support with alcohol and drug rehabilitation and inpatient services.  On assessment patient states he is feeling much better and feels as if he is ready to be discharged home.  Total Time spent with patient: 30 minutes  Past Psychiatric History:Depression, suicide ideations Alcohol abuse disorder Past Medical History: History reviewed. No pertinent past medical history. History reviewed. No pertinent surgical history. Family History: History reviewed. No pertinent family history. Family Psychiatric History: Denies Social History:  Social History   Substance and Sexual Activity  Alcohol Use Yes   Comment: 1/5 of liquor a day     Social History   Substance and Sexual Activity  Drug Use Never    Social History   Socioeconomic History  . Marital status: Married    Spouse name: Not on file  . Number of children: Not on file  . Years of education: Not on file  . Highest education level: Not on file  Occupational History  . Not on file  Tobacco Use  . Smoking status: Never Smoker  . Smokeless tobacco: Never Used  Substance and Sexual Activity  . Alcohol use: Yes    Comment: 1/5 of liquor a day  . Drug use: Never  . Sexual activity: Not on file  Other Topics Concern  . Not on file  Social  History Narrative  . Not on file   Social Determinants of Health   Financial Resource Strain:   . Difficulty of Paying Living Expenses:   Food Insecurity:   . Worried About Programme researcher, broadcasting/film/video in the Last Year:   . Barista in the Last Year:   Transportation Needs:   . Freight forwarder (Medical):   Marland Kitchen Lack of  Transportation (Non-Medical):   Physical Activity:   . Days of Exercise per Week:   . Minutes of Exercise per Session:   Stress:   . Feeling of Stress :   Social Connections:   . Frequency of Communication with Friends and Family:   . Frequency of Social Gatherings with Friends and Family:   . Attends Religious Services:   . Active Member of Clubs or Organizations:   . Attends Banker Meetings:   Marland Kitchen Marital Status:    SDOH:  SDOH Screenings   Alcohol Screen:   . Last Alcohol Screening Score (AUDIT):   Depression (PHQ2-9): Medium Risk  . PHQ-2 Score: 13  Financial Resource Strain:   . Difficulty of Paying Living Expenses:   Food Insecurity:   . Worried About Programme researcher, broadcasting/film/video in the Last Year:   . [employer] of Food in the Last Year:   Housing:   . Last Housing Risk Score:   Physical Activity:   . Days of Exercise per Week:   . Minutes of Exercise per Session:   Social Connections:   . Frequency of Communication with Friends and Family:   . Frequency of Social Gatherings with Friends and Family:   . Attends Religious Services:   . Active Member of Clubs or Organizations:   . Attends Banker Meetings:   Marland Kitchen Marital Status:   Stress:   . Feeling of Stress :   Tobacco Use: Low Risk   . Smoking Tobacco Use: Never Smoker  . Smokeless Tobacco Use: Never Used  Transportation Needs:   . Freight forwarder (Medical):   Marland Kitchen Lack of Transportation (Non-Medical):     Has this patient used any form of tobacco in the last 30 days? (Cigarettes, Smokeless Tobacco, Cigars, and/or Pipes) Prescription not provided because: does not smoke  Current Medications:  Current Facility-Administered Medications  Medication Dose Route Frequency Provider Last Rate Last Admin  . acetaminophen (TYLENOL) tablet 650 mg  650 mg Oral Q6H PRN Money, Gerlene Burdock, FNP   650 mg at 09/21/19 0122  . alum & mag hydroxide-simeth (MAALOX/MYLANTA) 200-200-20 MG/5ML suspension 30 mL  30 mL  Oral Q4H PRN Money, Gerlene Burdock, FNP      . chlordiazePOXIDE (LIBRIUM) capsule 25 mg  25 mg Oral Q6H PRN Money, Gerlene Burdock, FNP      . chlordiazePOXIDE (LIBRIUM) capsule 25 mg  25 mg Oral TID Money, Gerlene Burdock, FNP   25 mg at 09/21/19 0910   Followed by  . [START ON 09/22/2019] chlordiazePOXIDE (LIBRIUM) capsule 25 mg  25 mg Oral BH-qamhs Money, Gerlene Burdock, FNP       Followed by  . [START ON 09/23/2019] chlordiazePOXIDE (LIBRIUM) capsule 25 mg  25 mg Oral Daily Money, Gerlene Burdock, FNP      . hydrOXYzine (ATARAX/VISTARIL) tablet 25 mg  25 mg Oral Q6H PRN Money, Gerlene Burdock, FNP   25 mg at 09/21/19 0122  . loperamide (IMODIUM) capsule 2-4 mg  2-4 mg Oral PRN Money, Gerlene Burdock, FNP      . magnesium  hydroxide (MILK OF MAGNESIA) suspension 30 mL  30 mL Oral Daily PRN Money, Gerlene Burdock, FNP      . multivitamin with minerals tablet 1 tablet  1 tablet Oral Daily Money, Gerlene Burdock, FNP   1 tablet at 09/21/19 0910  . ondansetron (ZOFRAN-ODT) disintegrating tablet 4 mg  4 mg Oral Q6H PRN Money, Gerlene Burdock, FNP      . thiamine tablet 100 mg  100 mg Oral Daily Money, Gerlene Burdock, FNP   100 mg at 09/21/19 0910  . traZODone (DESYREL) tablet 50 mg  50 mg Oral QHS PRN Money, Gerlene Burdock, FNP   50 mg at 09/21/19 0123   No current outpatient medications on file.    PTA Medications: (Not in a hospital admission)   Musculoskeletal  Strength & Muscle Tone: within normal limits Gait & Station: normal Patient leans: N/A  Psychiatric Specialty Exam  Presentation  General Appearance: Appropriate for Environment;Fairly Groomed;Neat  Eye Contact:Fair  Speech:Clear and Coherent  Speech Volume:Normal  Handedness:Right   Mood and Affect  Mood:Euthymic  Affect:Appropriate;Congruent   Thought Process  Thought Processes:Coherent  Descriptions of Associations:Circumstantial  Orientation:Full (Time, Place and Person)  Thought Content:Logical  Hallucinations:Hallucinations: None  Ideas of Reference:None  Suicidal  Thoughts:Suicidal Thoughts: No  Homicidal Thoughts:Homicidal Thoughts: No   Sensorium  Memory:Immediate Fair;Recent Fair;Remote Fair  Judgment:Fair  Insight:Fair   Executive Functions  Concentration:Fair  Attention Span:Fair  Recall:Fair  Fund of Knowledge:Fair  Language:Fair   Psychomotor Activity  Psychomotor Activity:Psychomotor Activity: Normal   Assets  Assets:Communication Skills;Leisure Time;Physical Health;Desire for Improvement;Financial Resources/Insurance;Social Support;Housing   Sleep  Sleep:Sleep: Fair   Physical Exam  Physical Exam ROS Blood pressure (!) 137/93, pulse (!) 112, temperature 97.9 F (36.6 C), temperature source Temporal, resp. rate 18, SpO2 100 %. There is no height or weight on file to calculate BMI.  Demographic Factors:  Male, Low socioeconomic status and Living alone  Loss Factors: Loss of significant relationship and Financial problems/change in socioeconomic status  Historical Factors: Prior suicide attempts, Family history of suicide and Family history of mental illness or substance abuse  Risk Reduction Factors:   Responsible for children under 58 years of age, Sense of responsibility to family, Positive social support, Positive therapeutic relationship and Positive coping skills or problem solving skills  Continued Clinical Symptoms:  Severe Anxiety and/or Agitation Alcohol/Substance Abuse/Dependencies Unstable or Poor Therapeutic Relationship Previous Psychiatric Diagnoses and Treatments  Cognitive Features That Contribute To Risk:  None    Suicide Risk:  Minimal: No identifiable suicidal ideation.  Patients presenting with no risk factors but with morbid ruminations; may be classified as minimal risk based on the severity of the depressive symptoms  Plan Of Care/Follow-up recommendations:  Other:  Will need to follow up with outpatient behavioral health for chemical dependent intensive outpatient (CDIOP) as you  would be a great candidate. Referral infromtion has been included for substance abuse programs in the area in addition to primary care providers.   Disposition: Please continue to take medications as directed. If your symptoms return, worsen, or persist please call your 911, report to local ER, or contact crisis hotline. Please do not drink alcohol or use any illegal substances while taking prescription medications.    Maryagnes Amos, FNP 09/21/2019, 1:09 PM

## 2021-05-16 IMAGING — CR DG CHEST 2V
2 series · 2 of 2 positions shown · non-contrast
Comparison: None.

CLINICAL DATA: Chest pain.

EXAM:
CHEST - 2 VIEW

[w chest pa]
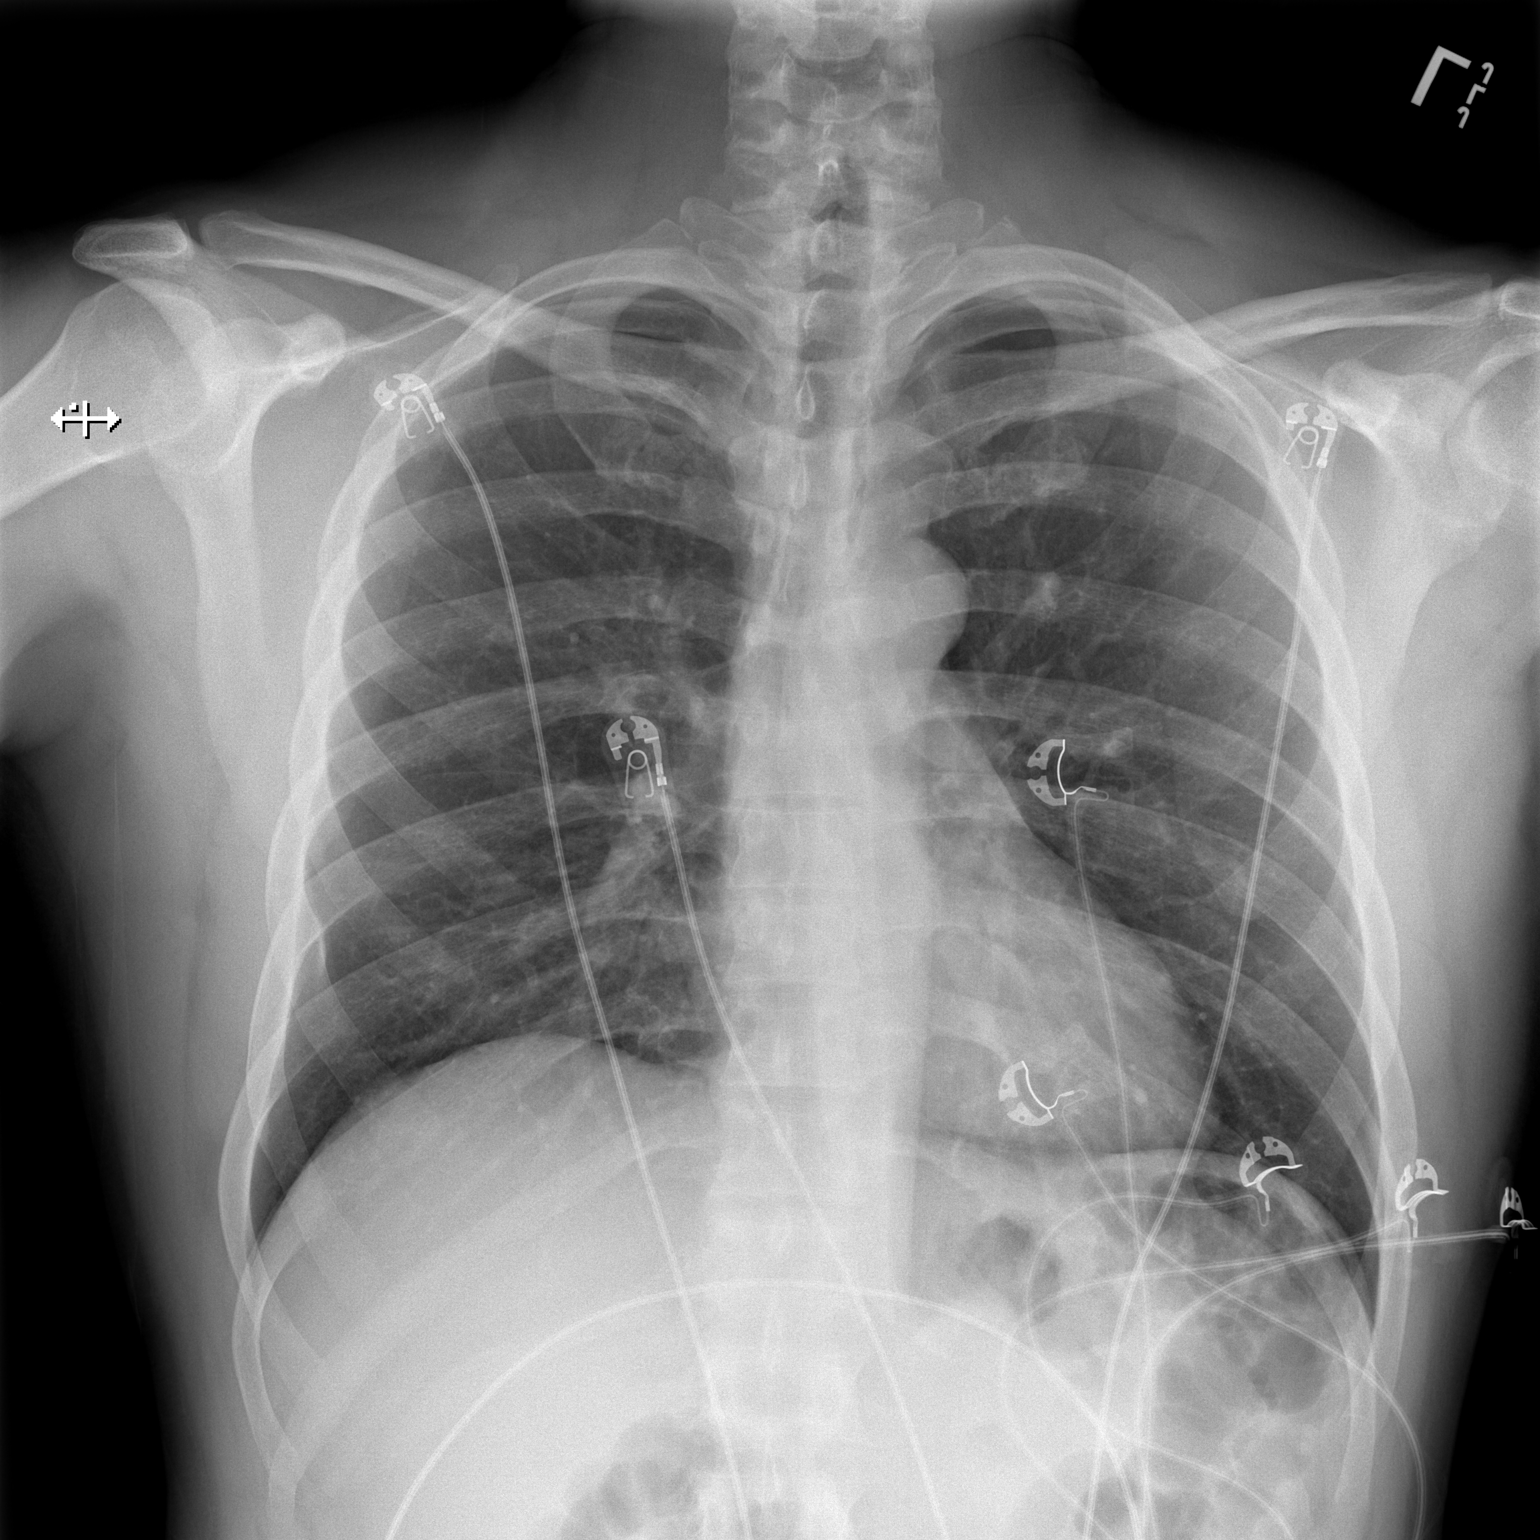

[w chest lat]
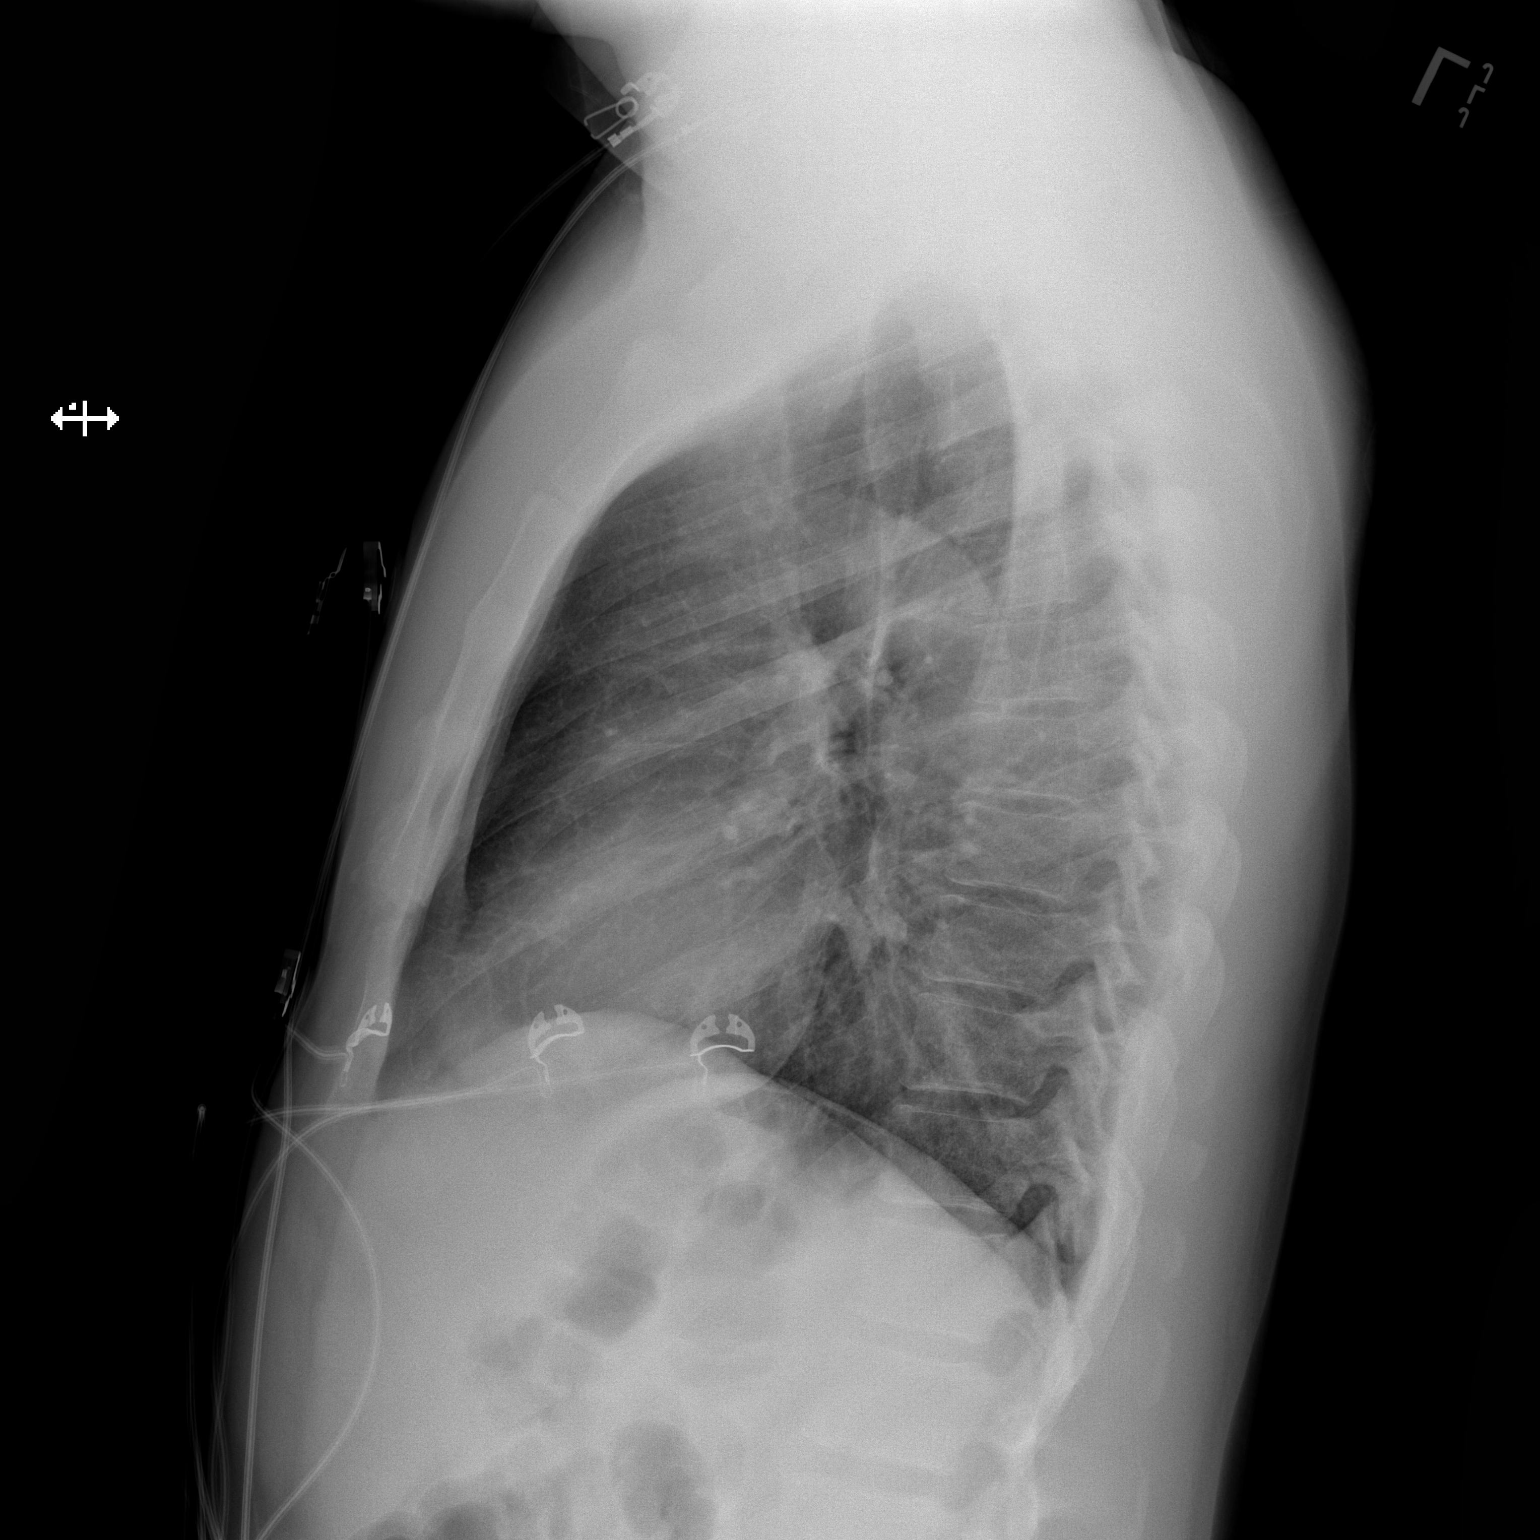

[2 of 2 positions shown; findings below may reference images not displayed]

FINDINGS: The cardiomediastinal contours are normal. The lungs are clear.
Pulmonary vasculature is normal. No consolidation, pleural effusion,
or pneumothorax. No acute osseous abnormalities are seen.
IMPRESSION: Negative radiographs of the chest.

## 2022-03-25 ENCOUNTER — Emergency Department (HOSPITAL_COMMUNITY): Payer: No Typology Code available for payment source

## 2022-03-25 ENCOUNTER — Encounter (HOSPITAL_COMMUNITY): Payer: Self-pay

## 2022-03-25 ENCOUNTER — Other Ambulatory Visit: Payer: Self-pay

## 2022-03-25 ENCOUNTER — Emergency Department (HOSPITAL_COMMUNITY)
Admission: EM | Admit: 2022-03-25 | Discharge: 2022-03-26 | Disposition: A | Discharge status: Home / Self Care | Payer: No Typology Code available for payment source | Attending: Emergency Medicine | Admitting: Emergency Medicine

## 2022-03-25 DIAGNOSIS — R0789 Other chest pain: Secondary | ICD-10-CM

## 2022-03-25 DIAGNOSIS — Z1152 Encounter for screening for COVID-19: Secondary | ICD-10-CM | POA: Insufficient documentation

## 2022-03-25 DIAGNOSIS — R052 Subacute cough: Secondary | ICD-10-CM | POA: Diagnosis not present

## 2022-03-25 LAB — BASIC METABOLIC PANEL
Anion gap: 9 (ref 5–15)
BUN: 11 mg/dL (ref 6–20)
CO2: 23 mmol/L (ref 22–32)
Calcium: 8.9 mg/dL (ref 8.9–10.3)
Chloride: 104 mmol/L (ref 98–111)
Creatinine, Ser: 1.26 mg/dL — ABNORMAL HIGH (ref 0.61–1.24)
GFR, Estimated: >60 mL/min (ref >60)
Glucose, Bld: 105 mg/dL — ABNORMAL HIGH (ref 70–99)
Potassium: 3.4 mmol/L — ABNORMAL LOW (ref 3.5–5.1)
Sodium: 136 mmol/L (ref 135–145)

## 2022-03-25 LAB — CBC WITH DIFFERENTIAL/PLATELET
Abs Immature Granulocytes: 0.03 10*3/uL (ref 0.00–0.07)
Basophils Absolute: 0.1 10*3/uL (ref 0.0–0.1)
Basophils Relative: 2 %
Eosinophils Absolute: 0.2 10*3/uL (ref 0.0–0.5)
Eosinophils Relative: 4 %
HCT: 45.3 % (ref 39.0–52.0)
Hemoglobin: 15.7 g/dL (ref 13.0–17.0)
Immature Granulocytes: 1 %
Lymphocytes Relative: 30 %
Lymphs Abs: 1.6 10*3/uL (ref 0.7–4.0)
MCH: 31.4 pg (ref 26.0–34.0)
MCHC: 34.7 g/dL (ref 30.0–36.0)
MCV: 90.6 fL (ref 80.0–100.0)
Monocytes Absolute: 0.7 10*3/uL (ref 0.1–1.0)
Monocytes Relative: 13 %
Neutro Abs: 2.6 10*3/uL (ref 1.7–7.7)
Neutrophils Relative %: 50 %
Platelets: 222 10*3/uL (ref 150–400)
RBC: 5 MIL/uL (ref 4.22–5.81)
RDW: 12 % (ref 11.5–15.5)
WBC: 5.2 10*3/uL (ref 4.0–10.5)
nRBC: 0 % (ref 0.0–0.2)

## 2022-03-25 LAB — RESP PANEL BY RT-PCR (RSV, FLU A&B, COVID)  RVPGX2
Influenza A by PCR: NEGATIVE
Influenza B by PCR: NEGATIVE
Resp Syncytial Virus by PCR: NEGATIVE
SARS Coronavirus 2 by RT PCR: NEGATIVE

## 2022-03-25 LAB — TROPONIN I (HIGH SENSITIVITY): Troponin I (High Sensitivity): 3 ng/L (ref <18)

## 2022-03-25 MED ORDER — KETOROLAC TROMETHAMINE 15 MG/ML IJ SOLN
30.0000 mg | Freq: Once | INTRAMUSCULAR | Status: AC
  Administered 2022-03-25: 30 mg via INTRAMUSCULAR
  Filled 2022-03-25: qty 2

## 2022-03-25 NOTE — ED Triage Notes (Signed)
Pt c/o constant chest pain x4 days. Pt states it feels like something is sitting on his chest. Pt states he has hx of punctured lung and it feels the same. Pt c/o persistent cough x2 weeks.

## 2022-03-25 NOTE — ED Provider Notes (Signed)
[facility] DEPT Provider Note   CSN: [id number] Arrival date & time: 03/25/22  1546     History {Add pertinent medical, surgical, social history, OB history to HPI:1} Chief Complaint  Patient presents with   Chest Pain    Donald Woods is a 42 y.o. male.  HPI     This is a 42 year old male who presents with chest discomfort.  Patient reports that he stopped smoking approximately 3 weeks ago.  Over the last several weeks he has had a cough which has been nonproductive.  He states over the last 3 to 4 days he has had a sharp pain in the anterior chest.  It is nonradiating.  It is worse with coughing and movement.  He thinks it may be related to a pulled muscle.  He has not had any fevers.  No other upper respiratory symptoms.  Home Medications Prior to Admission medications   Not on File      Allergies    Patient has no known allergies.    Review of Systems   Review of Systems  Constitutional:  Negative for fever.  Respiratory:  Positive for cough.   Cardiovascular:  Positive for chest pain.  All other systems reviewed and are negative.   Physical Exam Updated Vital Signs BP (!) 149/96   Pulse 62   Temp 97.9 F (36.6 C) (Oral)   Resp (!) 23   SpO2 98%  Physical Exam Vitals and nursing note reviewed.  Constitutional:      Appearance: He is well-developed. He is not ill-appearing.  HENT:     Head: Normocephalic and atraumatic.  Eyes:     Pupils: Pupils are equal, round, and reactive to light.  Cardiovascular:     Rate and Rhythm: Normal rate and regular rhythm.     Heart sounds: Normal heart sounds. No murmur heard. Pulmonary:     Effort: Pulmonary effort is normal. No respiratory distress.     Breath sounds: Normal breath sounds. No wheezing.  Chest:     Chest wall: Tenderness present.  Abdominal:     General: Bowel sounds are normal.     Palpations: Abdomen is soft.     Tenderness: There is no abdominal tenderness. There is  no rebound.  Musculoskeletal:     Cervical back: Neck supple.  Lymphadenopathy:     Cervical: No cervical adenopathy.  Skin:    General: Skin is warm and dry.  Neurological:     General: No focal deficit present.     Mental Status: He is alert and oriented to person, place, and time.     ED Results / Procedures / Treatments   Labs (all labs ordered are listed, but only abnormal results are displayed) Labs Reviewed  BASIC METABOLIC PANEL - Abnormal; Notable for the following components:      Result Value   Potassium 3.4 (*)    Glucose, Bld 105 (*)    Creatinine, Ser 1.26 (*)    All other components within normal limits  RESP PANEL BY RT-PCR (RSV, FLU A&B, COVID)  RVPGX2  CBC WITH DIFFERENTIAL/PLATELET  TROPONIN I (HIGH SENSITIVITY)  TROPONIN I (HIGH SENSITIVITY)    EKG None  Radiology DG Chest 2 View  Result Date: 03/25/2022 CLINICAL DATA:  Shortness of breath and chest pain EXAM: CHEST - 2 VIEW COMPARISON:  Chest radiograph September 20, 2019 FINDINGS: The heart size and mediastinal contours are within normal limits. No focal consolidation. No pleural effusion. No pneumothorax. The  visualized skeletal structures are unremarkable. IMPRESSION: No active cardiopulmonary disease. Electronically Signed   By: Dahlia Bailiff M.D.   On: 03/25/2022 17:03    Procedures Procedures  {Document cardiac monitor, telemetry assessment procedure when appropriate:1}  Medications Ordered in ED Medications  ketorolac (TORADOL) 15 MG/ML injection 30 mg (has no administration in time range)    ED Course/ Medical Decision Making/ A&P                           Medical Decision Making Risk Prescription drug management.   ***  {Document critical care time when appropriate:1} {Document review of labs and clinical decision tools ie heart score, Chads2Vasc2 etc:1}  {Document your independent review of radiology images, and any outside records:1} {Document your discussion with family members,  caretakers, and with consultants:1} {Document social determinants of health affecting pt's care:1} {Document your decision making why or why not admission, treatments were needed:1} Final Clinical Impression(s) / ED Diagnoses Final diagnoses:  None    Rx / DC Orders ED Discharge Orders     None

## 2022-03-25 NOTE — ED Provider Triage Note (Signed)
Emergency Medicine Provider Triage Evaluation Note  Donald Woods , a 42 y.o. male  was evaluated in triage.  Pt complains of chest pain starting 3-4 days ago. Describes as a constant, stabbing, right-sided chest pain. Feels mildly short of breath. States he has had constant cough x2 weeks that has not subsided. Denies palpitations, fever.   Review of Systems  Positive: See above Negative:   Physical Exam  BP (!) 148/111 (BP Location: Left Arm)   Pulse 86   Temp 97.7 F (36.5 C) (Oral)   Resp 11   SpO2 100%  Gen:   Awake, no distress   Resp:  Normal effort  MSK:   Moves extremities without difficulty  Other:  Lungs clear bilaterally   Medical Decision Making  Medically screening exam initiated at 4:09 PM.  Appropriate orders placed.  Journey Ratterman was informed that the remainder of the evaluation will be completed by another provider, this initial triage assessment does not replace that evaluation, and the importance of remaining in the ED until their evaluation is complete.     Mickie Hillier, PA-C 03/25/22 [phone]

## 2022-03-26 LAB — TROPONIN I (HIGH SENSITIVITY): Troponin I (High Sensitivity): 3 ng/L (ref <18)

## 2022-03-26 MED ORDER — IBUPROFEN 600 MG PO TABS: 600.0000 mg | ORAL_TABLET | Freq: Four times a day (QID) | ORAL | 0 refills | Status: AC | PRN

## 2022-03-26 NOTE — Discharge Instructions (Addendum)
You were seen today for chest pain.  Your workup today is reassuring including heart testing.  I highly suspect that your pain is musculoskeletal in nature.  You may take ibuprofen 600 mg 4 times a day to see if this helps with your symptoms.

## 2022-10-08 ENCOUNTER — Encounter (HOSPITAL_COMMUNITY): Payer: Self-pay | Admitting: Emergency Medicine

## 2022-10-08 ENCOUNTER — Emergency Department (HOSPITAL_COMMUNITY)
Admission: EM | Admit: 2022-10-08 | Discharge: 2022-10-08 | Discharge status: Against Medical Advice | Payer: No Typology Code available for payment source | Attending: Emergency Medicine | Admitting: Emergency Medicine

## 2022-10-08 ENCOUNTER — Emergency Department (HOSPITAL_COMMUNITY): Payer: No Typology Code available for payment source

## 2022-10-08 ENCOUNTER — Other Ambulatory Visit: Payer: Self-pay

## 2022-10-08 DIAGNOSIS — F10120 Alcohol abuse with intoxication, uncomplicated: Secondary | ICD-10-CM | POA: Diagnosis present

## 2022-10-08 DIAGNOSIS — R079 Chest pain, unspecified: Secondary | ICD-10-CM | POA: Insufficient documentation

## 2022-10-08 DIAGNOSIS — Z5321 Procedure and treatment not carried out due to patient leaving prior to being seen by health care provider: Secondary | ICD-10-CM | POA: Insufficient documentation

## 2022-10-08 DIAGNOSIS — Y909 Presence of alcohol in blood, level not specified: Secondary | ICD-10-CM | POA: Insufficient documentation

## 2022-10-08 NOTE — ED Notes (Signed)
Care delay due to patient not being in his room.

## 2022-10-08 NOTE — ED Triage Notes (Signed)
Pt arriving POV for chest pain x2 weeks and states he is having ETOH withdrawal symptoms. Last alcoholic beverage was 1/2 gallon of rum today prior to arrival.

## 2022-10-18 ENCOUNTER — Other Ambulatory Visit: Payer: Self-pay

## 2022-10-18 ENCOUNTER — Encounter (HOSPITAL_BASED_OUTPATIENT_CLINIC_OR_DEPARTMENT_OTHER): Payer: Self-pay

## 2022-10-18 ENCOUNTER — Emergency Department (HOSPITAL_BASED_OUTPATIENT_CLINIC_OR_DEPARTMENT_OTHER)
Admission: EM | Admit: 2022-10-18 | Discharge: 2022-10-19 | Disposition: A | Discharge status: Home / Self Care | Payer: No Typology Code available for payment source | Attending: Emergency Medicine | Admitting: Emergency Medicine

## 2022-10-18 DIAGNOSIS — Y900 Blood alcohol level of less than 20 mg/100 ml: Secondary | ICD-10-CM | POA: Insufficient documentation

## 2022-10-18 DIAGNOSIS — F10930 Alcohol use, unspecified with withdrawal, uncomplicated: Secondary | ICD-10-CM | POA: Insufficient documentation

## 2022-10-18 NOTE — ED Triage Notes (Signed)
BIB EMS, amb to triage, A&O x 4, GCS 15  CIWA 17 18G started en route  Last drink last night before bed, typically drinks fifth of liquor everyday, no alcohol today. Hx of DT and withdrawals with admissions.

## 2022-10-19 LAB — CBC WITH DIFFERENTIAL/PLATELET
Abs Immature Granulocytes: 0.02 10*3/uL (ref 0.00–0.07)
Basophils Absolute: 0.1 10*3/uL (ref 0.0–0.1)
Basophils Relative: 2 %
Eosinophils Absolute: 0 10*3/uL (ref 0.0–0.5)
Eosinophils Relative: 0 %
HCT: 37.7 % — ABNORMAL LOW (ref 39.0–52.0)
Hemoglobin: 13.7 g/dL (ref 13.0–17.0)
Immature Granulocytes: 0 %
Lymphocytes Relative: 17 %
Lymphs Abs: 0.8 10*3/uL (ref 0.7–4.0)
MCH: 33.6 pg (ref 26.0–34.0)
MCHC: 36.3 g/dL — ABNORMAL HIGH (ref 30.0–36.0)
MCV: 92.4 fL (ref 80.0–100.0)
Monocytes Absolute: 0.6 10*3/uL (ref 0.1–1.0)
Monocytes Relative: 13 %
Neutro Abs: 3.1 10*3/uL (ref 1.7–7.7)
Neutrophils Relative %: 68 %
Platelets: 151 10*3/uL (ref 150–400)
RBC: 4.08 MIL/uL — ABNORMAL LOW (ref 4.22–5.81)
RDW: 13.1 % (ref 11.5–15.5)
WBC: 4.6 10*3/uL (ref 4.0–10.5)
nRBC: 0 % (ref 0.0–0.2)

## 2022-10-19 LAB — COMPREHENSIVE METABOLIC PANEL
ALT: 71 U/L — ABNORMAL HIGH (ref 0–44)
AST: 75 U/L — ABNORMAL HIGH (ref 15–41)
Albumin: 4.5 g/dL (ref 3.5–5.0)
Alkaline Phosphatase: 60 U/L (ref 38–126)
Anion gap: 19 — ABNORMAL HIGH (ref 5–15)
BUN: 15 mg/dL (ref 6–20)
CO2: 24 mmol/L (ref 22–32)
Calcium: 9.3 mg/dL (ref 8.9–10.3)
Chloride: 96 mmol/L — ABNORMAL LOW (ref 98–111)
Creatinine, Ser: 1 mg/dL (ref 0.61–1.24)
GFR, Estimated: >60 mL/min (ref >60)
Glucose, Bld: 77 mg/dL (ref 70–99)
Potassium: 3.7 mmol/L (ref 3.5–5.1)
Sodium: 139 mmol/L (ref 135–145)
Total Bilirubin: 1.1 mg/dL (ref 0.3–1.2)
Total Protein: 7 g/dL (ref 6.5–8.1)

## 2022-10-19 LAB — TROPONIN I (HIGH SENSITIVITY): Troponin I (High Sensitivity): 3 ng/L (ref <18)

## 2022-10-19 LAB — ETHANOL: Alcohol, Ethyl (B): <10 mg/dL (ref <10)

## 2022-10-19 MED ORDER — SODIUM CHLORIDE 0.9 % IV BOLUS
1000.0000 mL | Freq: Once | INTRAVENOUS | Status: AC
  Administered 2022-10-19: 1000 mL via INTRAVENOUS

## 2022-10-19 MED ORDER — LORAZEPAM 2 MG/ML IJ SOLN
2.0000 mg | Freq: Once | INTRAMUSCULAR | Status: AC
  Administered 2022-10-19: 2 mg via INTRAVENOUS
  Filled 2022-10-19: qty 1

## 2022-10-19 MED ORDER — CHLORDIAZEPOXIDE HCL 25 MG PO CAPS: ORAL_CAPSULE | ORAL | 0 refills | Status: AC

## 2022-10-19 NOTE — ED Provider Notes (Signed)
[facility] EMERGENC[facility] DEPARTMENT AT [facility] Provider Note   CSN: [id number] Arrival date & time: 10/18/22  2310     History  Chief Complaint  Patient presents with   Alcohol Intoxication    withdrawal    Donald Woods is a 42 y.o. male.  Patient is a 42 year old male with history of alcohol abuse.  Patient presenting today for evaluation of symptoms related to alcohol withdrawal.  He describes feeling very shaky and anxious.  He tells me he has been drinking 1/5 of vodka for many years.  His last drink was Thursday and now he believes he is going through withdrawals.  He describes pain in his chest, but no shortness of breath.  The history is provided by the patient.       Home Medications Prior to Admission medications   Medication Sig Start Date End Date Taking? Authorizing Provider  ibuprofen (ADVIL) 600 MG tablet Take 1 tablet (600 mg total) by mouth every 6 (six) hours as needed. 03/26/22   Horton, Mayer Masker, MD      Allergies    Patient has no known allergies.    Review of Systems   Review of Systems  All other systems reviewed and are negative.   Physical Exam Updated Vital Signs BP 134/87   Pulse 86   Temp 98 F (36.7 C)   Resp 18   Ht 5\' 9"  (1.753 m)   Wt 74.8 kg   SpO2 99%   BMI 24.37 kg/m  Physical Exam Vitals and nursing note reviewed.  Constitutional:      General: He is not in acute distress.    Appearance: He is well-developed. He is not diaphoretic.  HENT:     Head: Normocephalic and atraumatic.  Cardiovascular:     Rate and Rhythm: Normal rate and regular rhythm.     Heart sounds: No murmur heard.    No friction rub.  Pulmonary:     Effort: Pulmonary effort is normal. No respiratory distress.     Breath sounds: Normal breath sounds. No wheezing or rales.  Abdominal:     General: Bowel sounds are normal. There is no distension.     Palpations: Abdomen is soft.     Tenderness: There is no abdominal tenderness.   Musculoskeletal:        General: Normal range of motion.     Cervical back: Normal range of motion and neck supple.  Skin:    General: Skin is warm and dry.  Neurological:     Mental Status: He is alert and oriented to person, place, and time.     Coordination: Coordination normal.     ED Results / Procedures / Treatments   Labs (all labs ordered are listed, but only abnormal results are displayed) Labs Reviewed  COMPREHENSIVE METABOLIC PANEL  ETHANOL  CBC WITH DIFFERENTIAL/PLATELET  RAPID URINE DRUG SCREEN, HOSP PERFORMED  TROPONIN I (HIGH SENSITIVITY)    EKG EKG Interpretation Date/Time:  Saturday October 19 2022 02:14:12 EDT Ventricular Rate:  75 PR Interval:  121 QRS Duration:  88 QT Interval:  401 QTC Calculation: 448 R Axis:   72  Text Interpretation: Sinus rhythm Normal ECG Confirmed by Geoffery Lyons ([id number]) on 10/19/2022 2:26:05 AM  Radiology No results found.  Procedures Procedures    Medications Ordered in ED Medications  sodium chloride 0.9 % bolus 1,000 mL (has no administration in time range)  LORazepam (ATIVAN) injection 2 mg (has no administration in time range)  ED Course/ Medical Decision Making/ A&P  Patient with history of alcohol abuse presenting with complaints of alcohol withdrawal.  He has not had a drink in over 24 hours and now feels shaky.  He desires information for alcohol treatment.  Patient arrives here with stable vital signs and is afebrile.  Laboratory studies obtained including CBC, CMP, and EtOH.  He has mild elevations of his transaminases, but laboratory studies otherwise unremarkable.  Patient given normal saline along with 2 mg of Ativan for withdrawal symptoms and is feeling markedly improved.  I feel as though patient can safely be discharged.  He will be prescribed a Librium taper and given outpatient resources for alcohol treatment.  Final Clinical Impression(s) / ED Diagnoses Final diagnoses:  None    Rx / DC  Orders ED Discharge Orders     None         Geoffery Lyons, MD 10/19/22 [phone]

## 2022-10-19 NOTE — Discharge Instructions (Signed)
Begin taking Librium as prescribed.  Follow-up with outpatient treatment facilities as listed in the resource guide.

## 2022-10-19 NOTE — ED Notes (Signed)
All appropriate discharge materials reviewed at length with patient. Time for questions provided. Pt has no other questions at this time and verbalizes understanding of all provided materials.
